# Patient Record
Sex: Male | Born: 1945 | Race: Black or African American | Hispanic: No | Marital: Married | Smoking: Never smoker
Health system: Southern US, Community
[De-identification: ages and names within clinical notes are randomized; demographics above are authoritative.]

## PROBLEM LIST (undated history)

## (undated) DIAGNOSIS — I10 Essential (primary) hypertension: Secondary | ICD-10-CM

---

## 2006-04-06 ENCOUNTER — Inpatient Hospital Stay (HOSPITAL_COMMUNITY): Admission: RE | Admit: 2006-04-06 | Discharge: 2006-04-13 | Payer: Self-pay | Admitting: Neurosurgery

## 2006-05-14 ENCOUNTER — Ambulatory Visit: Payer: Self-pay | Admitting: Infectious Diseases

## 2006-05-14 ENCOUNTER — Inpatient Hospital Stay (HOSPITAL_COMMUNITY): Admission: AD | Admit: 2006-05-14 | Discharge: 2006-05-17 | Payer: Self-pay | Admitting: Neurosurgery

## 2006-06-13 ENCOUNTER — Ambulatory Visit (HOSPITAL_COMMUNITY): Admission: RE | Admit: 2006-06-13 | Discharge: 2006-06-13 | Payer: Self-pay | Admitting: Neurosurgery

## 2006-07-02 ENCOUNTER — Ambulatory Visit: Payer: Self-pay | Admitting: Infectious Diseases

## 2006-08-13 ENCOUNTER — Ambulatory Visit: Payer: Self-pay | Admitting: Infectious Diseases

## 2006-08-13 LAB — CONVERTED CEMR LAB
ALT: 15 units/L (ref 0–53)
AST: 16 units/L (ref 0–37)
Albumin: 4.7 g/dL (ref 3.5–5.2)
Calcium: 9.4 mg/dL (ref 8.4–10.5)
Chloride: 108 meq/L (ref 96–112)
Platelets: 355 10*3/uL (ref 152–374)
Potassium: 4.3 meq/L (ref 3.5–5.3)
Sed Rate: 14 mm/hr (ref 0–16)
Sodium: 140 meq/L (ref 135–145)
WBC: 8.7 10*3/uL (ref 3.7–10.0)

## 2008-07-21 IMAGING — CR DG LUMBAR SPINE 1V
1 series · 1 of 1 positions shown · non-contrast
Comparison: none

CLINICAL DATA: History of   stenosis and operative fusion.
 LUMBAR SPINE - 1 VIEW:
 A single lateral view is presented for evaluation.  Surgical hardware was placed along the posterior aspect of the L5 vertebral body.  The patient has multi-level degenerative endplate changes.  Alignment is grossly normal on this limited study.

[view not recorded]
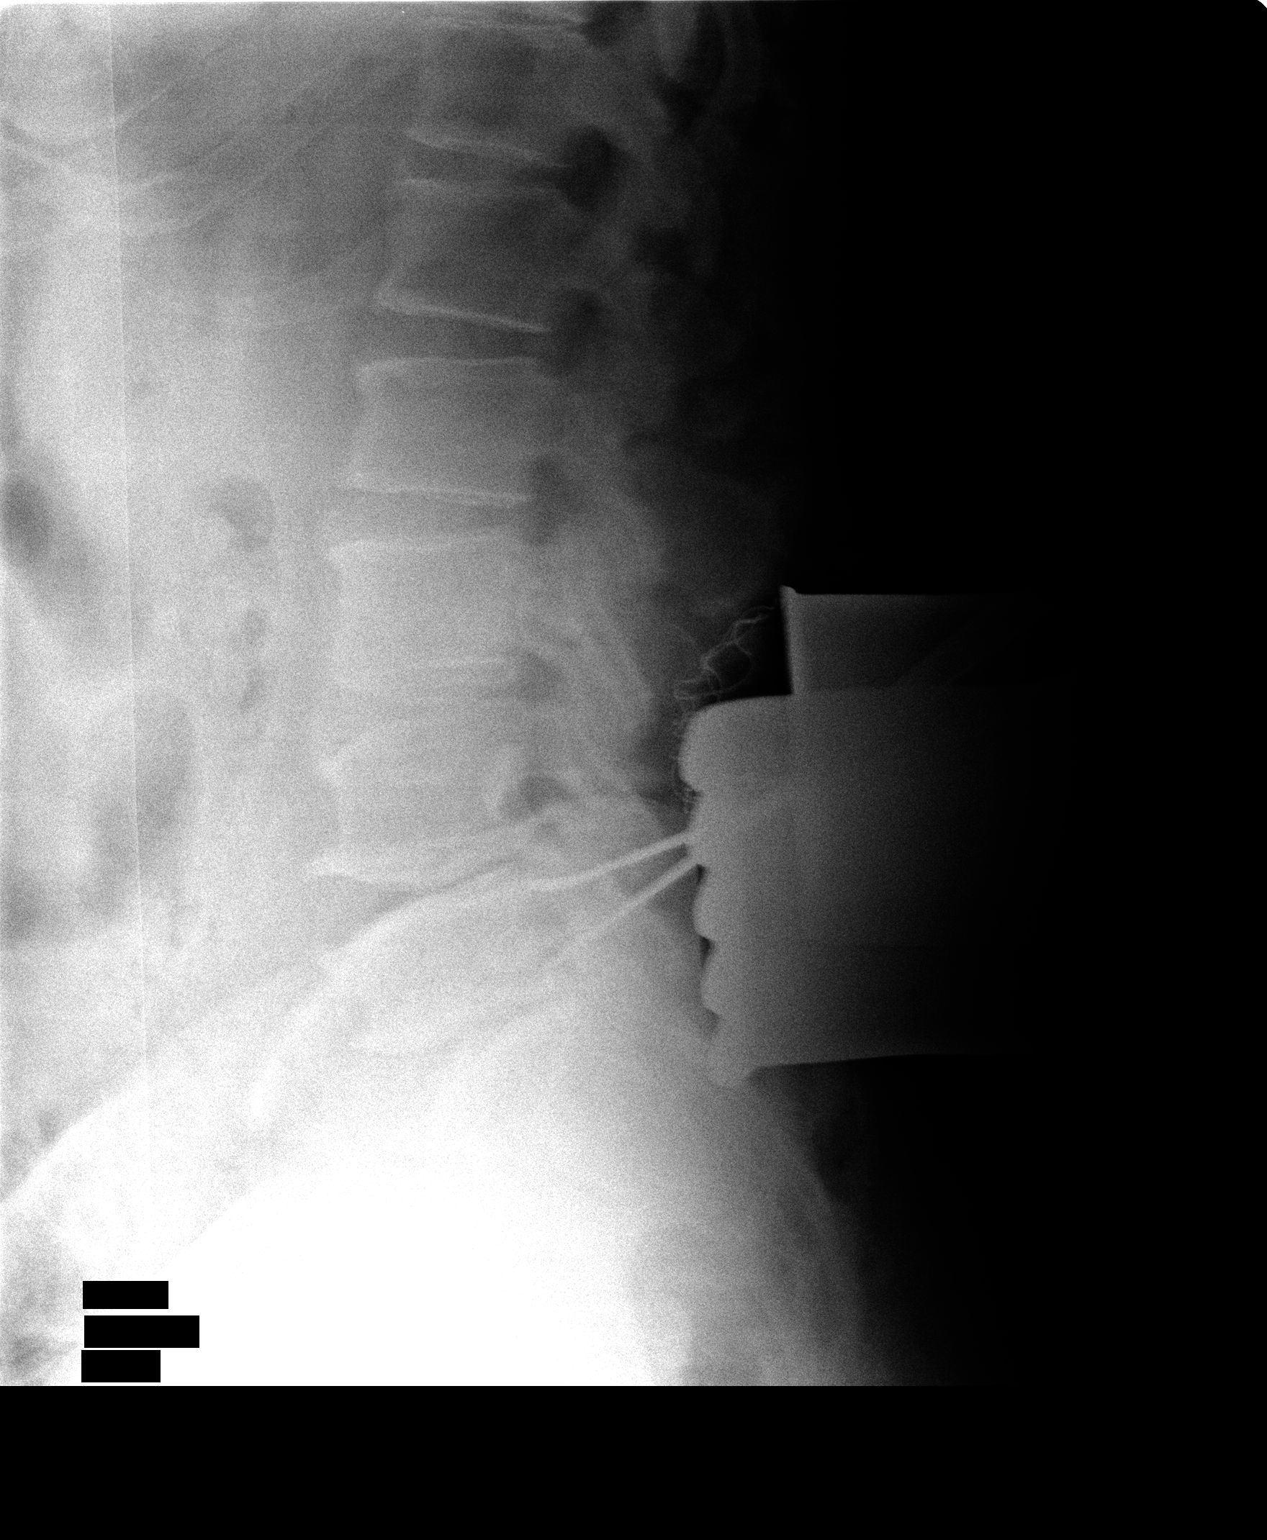

[1 of 1 positions shown; findings below may reference images not displayed]

IMPRESSION: Surgical marking along the posterior aspect of L5.

## 2008-07-21 IMAGING — RF DG LUMBAR SPINE 2-3V
1 series · 2 of 2 positions shown · non-contrast
Comparison: none

HISTORY: Spondylosis, stenosis, L4-L5 and L5-S1 discectomy and interbody fusion

LUMBAR SPINE 2 VIEWS:
Two fluoroscopic images were obtained intraoperatively.
AP and lateral views were formed.
Bilateral pedicle screws at L4, L5, and S1.

[Series 1: run · 2 of 2 slices shown]
[im 1/2]
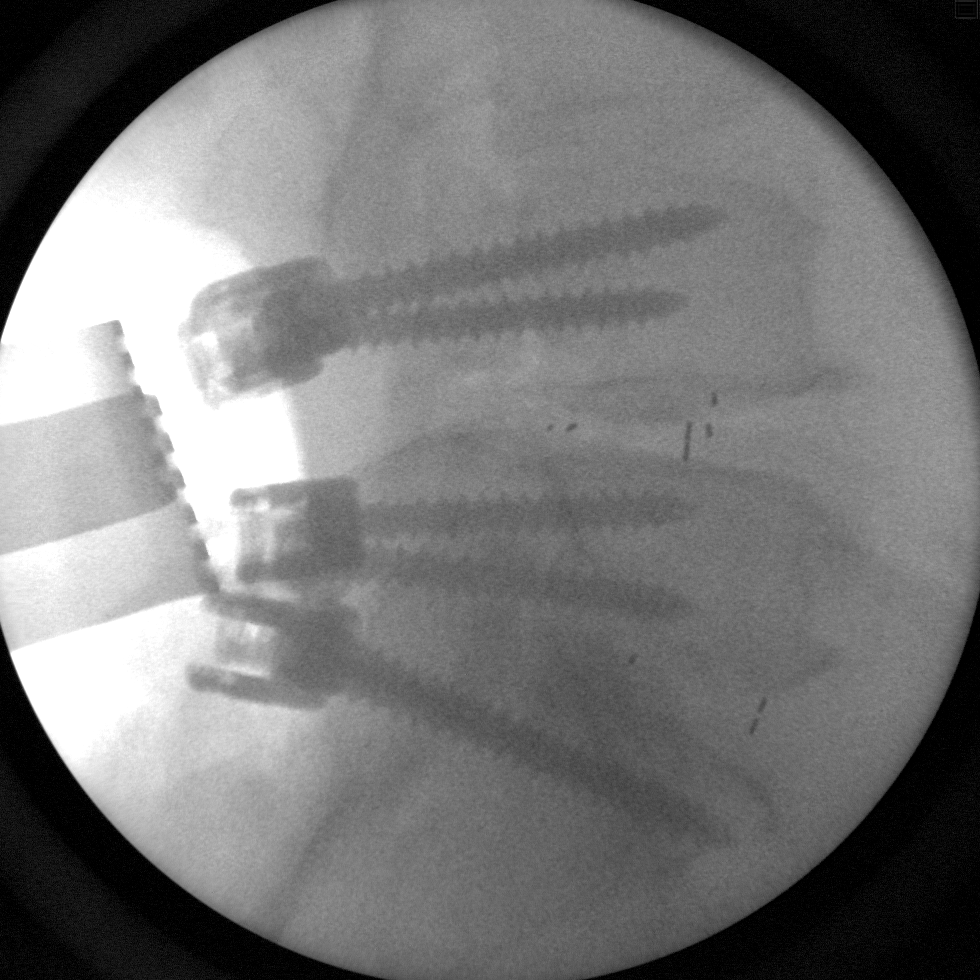
[im 2/2]
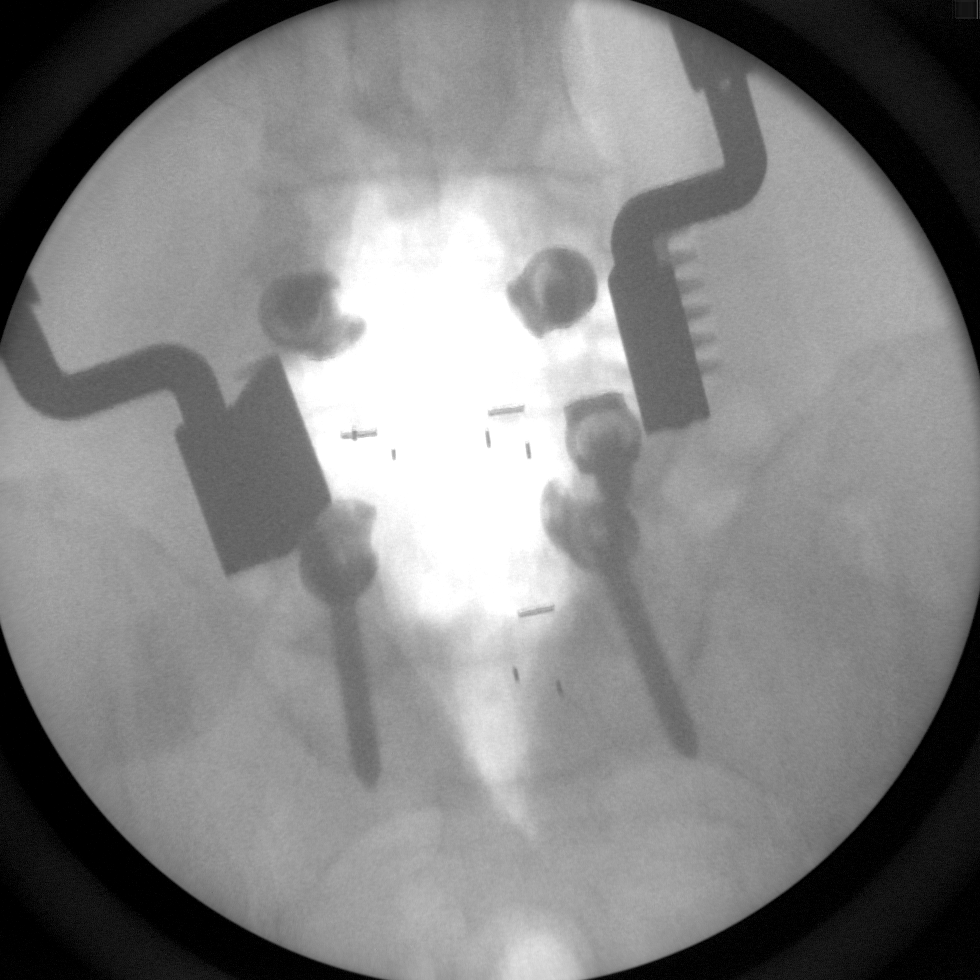

[2 of 2 positions shown; findings below may reference images not displayed]

IMPRESSION: Intraoperative images obtained during posterior fusion of L4-L5 and L5-S1.

## 2008-07-22 IMAGING — CR DG CHEST 1V PORT
1 series · 1 of 1 positions shown · non-contrast
Comparison: None
COMPARISON: None

CLINICAL DATA: Abdominal distention

PORTABLE CHEST - 1 VIEW:

[view not recorded]
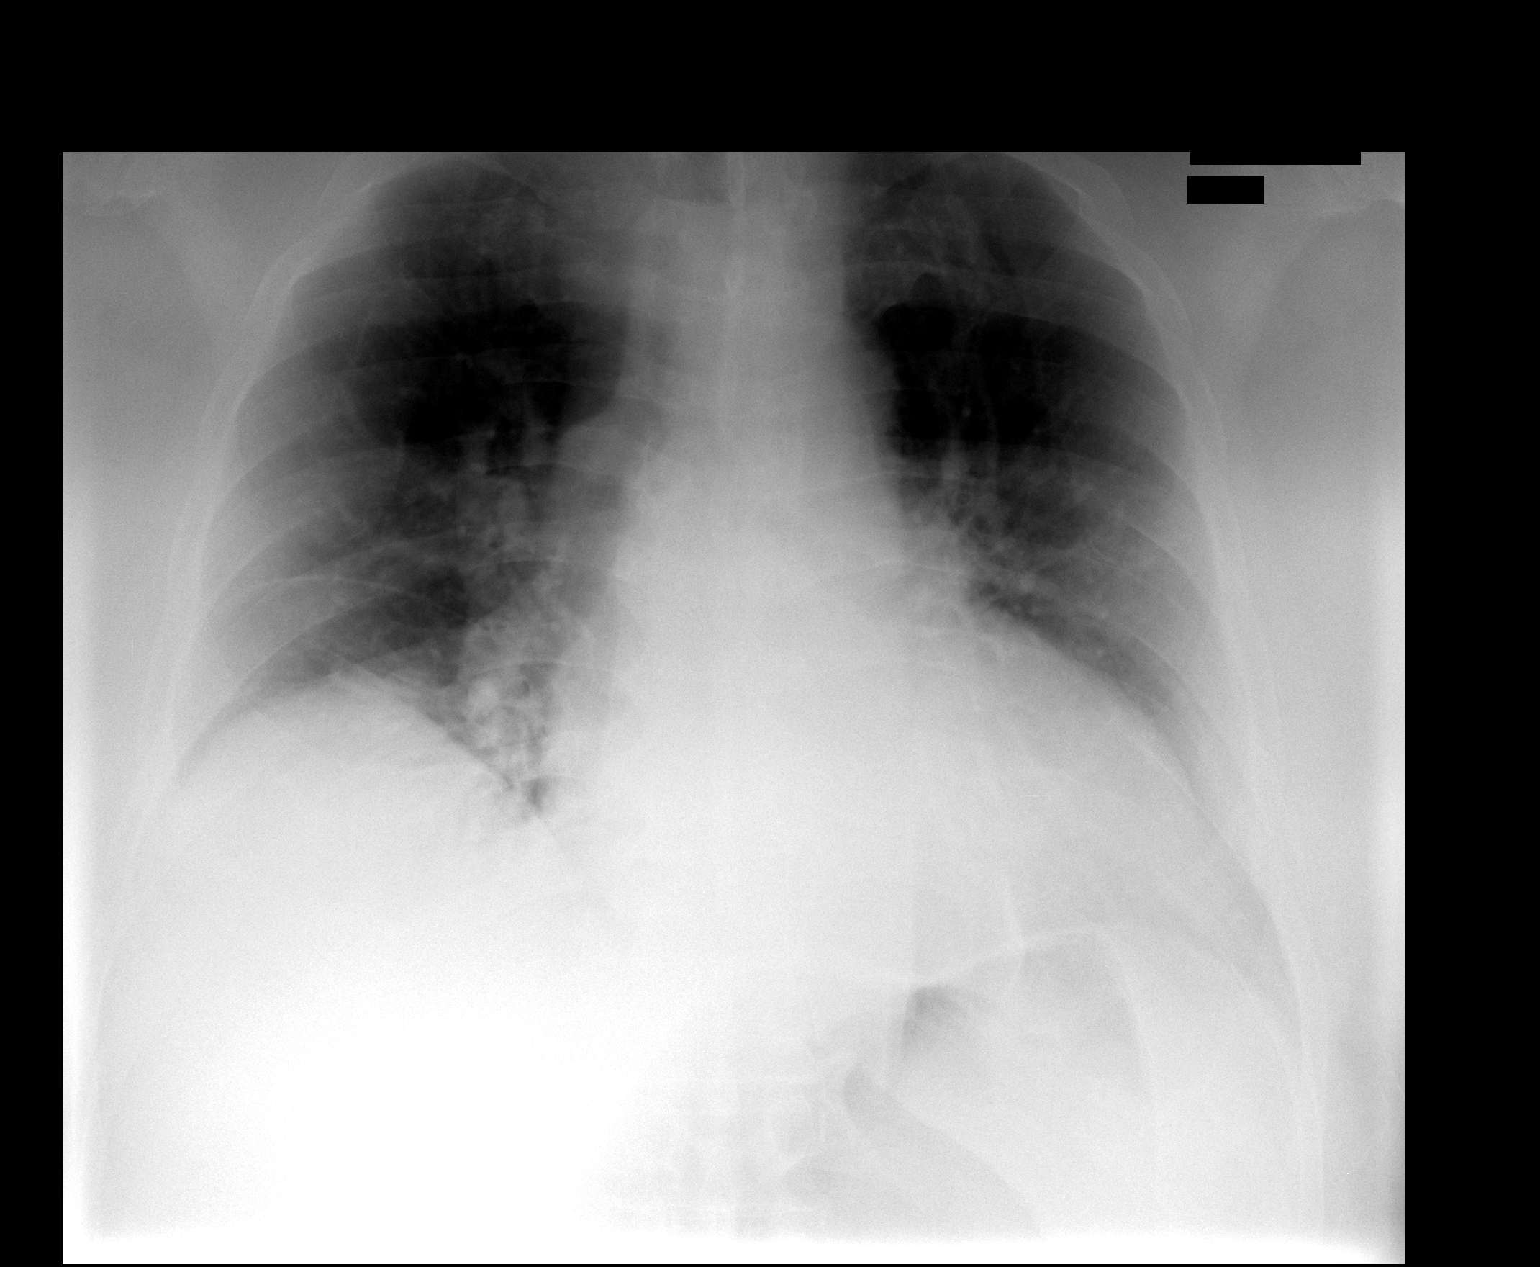

[1 of 1 positions shown; findings below may reference images not displayed]

FINDINGS: There is cardiomegaly. There is mild elevation of the right
hemidiaphragm with right basilar atelectasis or infiltrate. Minimal left base
atelectasis. No effusions.
IMPRESSION: Bibasilar opacities, right greater than left, likely atelectasis. Recommend
clinical correlation to exclude pneumonia in the right base.

Cardiomegaly

PORTABLE ABDOMEN - 1 VIEW
FINDINGS: There is marked gaseous distention of the stomach. Gas noted
throughout minimally distended small bowel as well. Patient status post
posterior lumbar fusion. Degenerative changes in the hips.

IMPRESSION

Marked gaseous distention of the stomach. Gas scattered throughout the small
bowel loops, much less distended than the stomach.

## 2013-11-11 ENCOUNTER — Ambulatory Visit (HOSPITAL_COMMUNITY): Admission: RE | Admit: 2013-11-11 | Payer: Self-pay | Source: Ambulatory Visit | Admitting: Orthopaedic Surgery

## 2013-11-11 ENCOUNTER — Encounter (HOSPITAL_COMMUNITY): Admission: RE | Payer: Self-pay | Source: Ambulatory Visit

## 2013-11-11 SURGERY — SYNOVECTOMY
Anesthesia: General | Site: Knee | Laterality: Right

## 2022-03-26 ENCOUNTER — Other Ambulatory Visit: Payer: Self-pay

## 2022-03-26 ENCOUNTER — Inpatient Hospital Stay (HOSPITAL_COMMUNITY)
Admission: AD | Admit: 2022-03-26 | Discharge: 2022-03-28 | DRG: 378 | Disposition: A | Discharge status: Home / Self Care | Payer: Medicare Other | Source: Other Acute Inpatient Hospital | Attending: Internal Medicine | Admitting: Internal Medicine

## 2022-03-26 DIAGNOSIS — K254 Chronic or unspecified gastric ulcer with hemorrhage: Secondary | ICD-10-CM | POA: Diagnosis present

## 2022-03-26 DIAGNOSIS — Z86010 Personal history of colonic polyps: Secondary | ICD-10-CM | POA: Diagnosis not present

## 2022-03-26 DIAGNOSIS — D62 Acute posthemorrhagic anemia: Secondary | ICD-10-CM | POA: Diagnosis present

## 2022-03-26 DIAGNOSIS — N179 Acute kidney failure, unspecified: Secondary | ICD-10-CM | POA: Diagnosis present

## 2022-03-26 DIAGNOSIS — N400 Enlarged prostate without lower urinary tract symptoms: Secondary | ICD-10-CM | POA: Diagnosis present

## 2022-03-26 DIAGNOSIS — Z7982 Long term (current) use of aspirin: Secondary | ICD-10-CM | POA: Diagnosis not present

## 2022-03-26 DIAGNOSIS — K921 Melena: Secondary | ICD-10-CM | POA: Diagnosis not present

## 2022-03-26 DIAGNOSIS — I452 Bifascicular block: Secondary | ICD-10-CM | POA: Diagnosis present

## 2022-03-26 DIAGNOSIS — K319 Disease of stomach and duodenum, unspecified: Secondary | ICD-10-CM | POA: Diagnosis present

## 2022-03-26 DIAGNOSIS — E785 Hyperlipidemia, unspecified: Secondary | ICD-10-CM | POA: Diagnosis present

## 2022-03-26 DIAGNOSIS — I10 Essential (primary) hypertension: Secondary | ICD-10-CM | POA: Diagnosis present

## 2022-03-26 DIAGNOSIS — K641 Second degree hemorrhoids: Secondary | ICD-10-CM | POA: Diagnosis present

## 2022-03-26 DIAGNOSIS — K5731 Diverticulosis of large intestine without perforation or abscess with bleeding: Secondary | ICD-10-CM | POA: Diagnosis present

## 2022-03-26 DIAGNOSIS — D123 Benign neoplasm of transverse colon: Secondary | ICD-10-CM | POA: Diagnosis not present

## 2022-03-26 DIAGNOSIS — K635 Polyp of colon: Secondary | ICD-10-CM | POA: Diagnosis present

## 2022-03-26 DIAGNOSIS — K449 Diaphragmatic hernia without obstruction or gangrene: Secondary | ICD-10-CM | POA: Diagnosis present

## 2022-03-26 DIAGNOSIS — K922 Gastrointestinal hemorrhage, unspecified: Secondary | ICD-10-CM | POA: Diagnosis present

## 2022-03-26 DIAGNOSIS — K259 Gastric ulcer, unspecified as acute or chronic, without hemorrhage or perforation: Secondary | ICD-10-CM | POA: Diagnosis not present

## 2022-03-26 DIAGNOSIS — R195 Other fecal abnormalities: Secondary | ICD-10-CM | POA: Diagnosis not present

## 2022-03-26 HISTORY — DX: Essential (primary) hypertension: I10

## 2022-03-26 MED ORDER — PANTOPRAZOLE SODIUM 40 MG IV SOLR
40.0000 mg | INTRAVENOUS | Status: DC
  Administered 2022-03-27: 40 mg via INTRAVENOUS
  Filled 2022-03-26: qty 10

## 2022-03-26 MED ORDER — SODIUM CHLORIDE 0.9 % IV SOLN: INTRAVENOUS | Status: AC

## 2022-03-26 NOTE — Assessment & Plan Note (Addendum)
Patient presents with rectal bleed for past 3 days.  On daily aspirin.  No previous history of GI bleed.  Possibly diverticular bleed but he is also complaining of epigastric pain. -We will continue IV Protonix and keep on IV fluids -Keep n.p.o. -Hgb stable at 10, will follow with morning labs -Message sent to [facility] for consult in the morning

## 2022-03-26 NOTE — H&P (Addendum)
History and Physical    Patient: Tristan Houston QMG:[id number] DOB: May 09, 1946 DOA: 03/26/2022 DOS: the patient was seen and examined on 03/26/2022 PCP: Pcp, No  Patient coming from: [facility]  Chief Complaint: bright red blood per rectum  HPI: Tristan Houston is a 76 y.o. male with medical history significant of  HTN, HLD, BPH who presents as a transfer from [facility] for GI bleed.  Patient presented to the ED earlier today after he noted about 3 days of diarrhea with bright red blood per rectum.  Last episode was earlier this morning associated with dizziness and diaphoresis prompting him to present to the ED.  He has also noticed some epigastric pain and diffuse abdominal cramping.  Denies any personal history of GI bleed.  He is on a daily aspirin with last dose this morning.  No other NSAID use.  He does take iron supplementation.  Reports having a colonoscopy about 4 to 5 years ago in [place] and was told to return back in another 10 years.  In the ED he was hemodynamically stable with EKG showing RBBB and LAFB.  FOBT was positive with melena on exam.  Hemoglobin was stable at 10.  Platelet of 217.  Creatinine of 1.44 with BUN of 28.  He was given IV Protonix and IV fluids in the ED.  Review of Systems: As mentioned in the history of present illness. All other systems reviewed and are negative.  Social History: Denies any tobacco, alcohol illicit drug use  Prior to Admission medications   Not on File    Physical Exam: Vitals:   03/26/22 1837  BP: (!) 168/71  Pulse: 71  Resp: 20  Temp: 98 F (36.7 C)  TempSrc: Oral  SpO2: 100%   Constitutional: NAD, calm, comfortable, elderly male laying comfortably in bed watching TV Eyes:  lids and conjunctivae normal ENMT: Mucous membranes are moist.  Neck: normal, supple,  Respiratory: clear to auscultation bilaterally, no wheezing, no crackles. Normal respiratory effort. No accessory muscle use.  Cardiovascular:  Regular rate and rhythm, no murmurs / rubs / gallops. No extremity edema.  Abdomen: Soft, nondistended, mild epigastric tenderness.  No rebound or guarding, or rigidity. bowel sounds positive.  Musculoskeletal: no clubbing / cyanosis. No joint deformity upper and lower extremities. Good ROM, no contractures. Normal muscle tone.  Skin: no rashes, lesions, ulcers.  Neurologic: CN 2-12 grossly intact.  Strength 5/5 in all 4.  Psychiatric: Normal judgment and insight. Alert and oriented x 3. Normal mood. Data Reviewed:  See HPI  Assessment and Plan: * Acute GI bleeding Patient presents with rectal bleed for past 3 days.  On daily aspirin.  No previous history of GI bleed.  Possibly diverticular bleed but he is also complaining of epigastric pain. -We will continue IV Protonix and keep on IV fluids -Keep n.p.o. -Hgb stable at 10, will follow with morning labs -Message sent to [facility] for consult in the morning  HLD (hyperlipidemia) Continue home meds when able to take PO  AKI (acute kidney injury) ([place]) Creatinine is elevated to 1.44 with BUN of 28.  Unclear baseline.  Continue IV fluids and monitor.   HTN (hypertension) Hold home antihypertensive for now on p.o.      Advance Care Planning:   Code Status: Full Code   Consults: Needs GI consult in the morning  Family Communication: none in the morning   Severity of Illness: The appropriate patient status for this patient is INPATIENT. Inpatient status is  judged to be reasonable and necessary in order to provide the required intensity of service to ensure the patient's safety. The patient's presenting symptoms, physical exam findings, and initial radiographic and laboratory data in the context of their chronic comorbidities is felt to place them at high risk for further clinical deterioration. Furthermore, it is not anticipated that the patient will be medically stable for discharge from the hospital within 2 midnights of admission.    * I certify that at the point of admission it is my clinical judgment that the patient will require inpatient hospital care spanning beyond 2 midnights from the point of admission due to high intensity of service, high risk for further deterioration and high frequency of surveillance required.*  Author: Orene Desanctis, DO 03/26/2022 8:04 PM  For on call review [email or web address].

## 2022-03-26 NOTE — Assessment & Plan Note (Signed)
Hold home antihypertensive for now on p.o.

## 2022-03-26 NOTE — Assessment & Plan Note (Signed)
Creatinine is elevated to 1.44 with BUN of 28.  Unclear baseline.  Continue IV fluids and monitor.

## 2022-03-26 NOTE — Assessment & Plan Note (Signed)
Continue home meds when able to take PO

## 2022-03-27 ENCOUNTER — Encounter (HOSPITAL_COMMUNITY): Payer: Self-pay | Admitting: Family Medicine

## 2022-03-27 DIAGNOSIS — K921 Melena: Secondary | ICD-10-CM

## 2022-03-27 DIAGNOSIS — D62 Acute posthemorrhagic anemia: Secondary | ICD-10-CM

## 2022-03-27 DIAGNOSIS — Z86010 Personal history of colonic polyps: Secondary | ICD-10-CM

## 2022-03-27 DIAGNOSIS — K922 Gastrointestinal hemorrhage, unspecified: Secondary | ICD-10-CM | POA: Diagnosis not present

## 2022-03-27 DIAGNOSIS — R195 Other fecal abnormalities: Secondary | ICD-10-CM

## 2022-03-27 DIAGNOSIS — N400 Enlarged prostate without lower urinary tract symptoms: Secondary | ICD-10-CM | POA: Insufficient documentation

## 2022-03-27 LAB — CBC
HCT: 24.1 % — ABNORMAL LOW (ref 39.0–52.0)
HCT: 25.3 % — ABNORMAL LOW (ref 39.0–52.0)
Hemoglobin: 7.9 g/dL — ABNORMAL LOW (ref 13.0–17.0)
Hemoglobin: 8.5 g/dL — ABNORMAL LOW (ref 13.0–17.0)
MCH: 31.1 pg (ref 26.0–34.0)
MCH: 32 pg (ref 26.0–34.0)
MCHC: 32.8 g/dL (ref 30.0–36.0)
MCHC: 33.6 g/dL (ref 30.0–36.0)
MCV: 94.9 fL (ref 80.0–100.0)
MCV: 95.1 fL (ref 80.0–100.0)
Platelets: 200 10*3/uL (ref 150–400)
Platelets: 217 10*3/uL (ref 150–400)
RBC: 2.54 MIL/uL — ABNORMAL LOW (ref 4.22–5.81)
RBC: 2.66 MIL/uL — ABNORMAL LOW (ref 4.22–5.81)
RDW: 11.9 % (ref 11.5–15.5)
RDW: 11.9 % (ref 11.5–15.5)
WBC: 5.5 10*3/uL (ref 4.0–10.5)
WBC: 5.7 10*3/uL (ref 4.0–10.5)
nRBC: 0 % (ref 0.0–0.2)
nRBC: 0 % (ref 0.0–0.2)

## 2022-03-27 LAB — COMPREHENSIVE METABOLIC PANEL
ALT: 17 U/L (ref 0–44)
AST: 20 U/L (ref 15–41)
Albumin: 3.2 g/dL — ABNORMAL LOW (ref 3.5–5.0)
Alkaline Phosphatase: 45 U/L (ref 38–126)
Anion gap: 5 (ref 5–15)
BUN: 16 mg/dL (ref 8–23)
CO2: 24 mmol/L (ref 22–32)
Calcium: 8.5 mg/dL — ABNORMAL LOW (ref 8.9–10.3)
Chloride: 108 mmol/L (ref 98–111)
Creatinine, Ser: 1.2 mg/dL (ref 0.61–1.24)
GFR, Estimated: >60 mL/min (ref >60)
Glucose, Bld: 157 mg/dL — ABNORMAL HIGH (ref 70–99)
Potassium: 4 mmol/L (ref 3.5–5.1)
Sodium: 137 mmol/L (ref 135–145)
Total Bilirubin: 0.8 mg/dL (ref 0.3–1.2)
Total Protein: 5.1 g/dL — ABNORMAL LOW (ref 6.5–8.1)

## 2022-03-27 LAB — AMYLASE: Amylase: 129 U/L — ABNORMAL HIGH (ref 28–100)

## 2022-03-27 LAB — LIPASE, BLOOD: Lipase: 85 U/L — ABNORMAL HIGH (ref 11–51)

## 2022-03-27 MED ORDER — SIMVASTATIN 20 MG PO TABS: 40.0000 mg | ORAL_TABLET | Freq: Every evening | ORAL | Status: DC

## 2022-03-27 MED ORDER — METOCLOPRAMIDE HCL 5 MG/ML IJ SOLN
10.0000 mg | Freq: Four times a day (QID) | INTRAMUSCULAR | Status: AC
  Administered 2022-03-27 (×2): 10 mg via INTRAVENOUS
  Filled 2022-03-27 (×2): qty 2

## 2022-03-27 MED ORDER — PEG-KCL-NACL-NASULF-NA ASC-C 100 G PO SOLR
0.5000 | Freq: Once | ORAL | Status: AC
  Administered 2022-03-27: 100 g via ORAL
  Filled 2022-03-27: qty 1

## 2022-03-27 MED ORDER — BISACODYL 5 MG PO TBEC
20.0000 mg | DELAYED_RELEASE_TABLET | Freq: Once | ORAL | Status: AC
  Administered 2022-03-27: 20 mg via ORAL
  Filled 2022-03-27: qty 4

## 2022-03-27 MED ORDER — PEG-KCL-NACL-NASULF-NA ASC-C 100 G PO SOLR: 1.0000 | Freq: Once | ORAL | Status: DC

## 2022-03-27 NOTE — Consult Note (Signed)
[facility] Consult: 8:14 AM 03/27/2022  LOS: 1 day    Referring Provider: Dr Si Raider  Primary Care Physician:  Pcp, No Primary Gastroenterologist:  unassigned.   GI MD is in [place], Dr. Wilburn Cornelia.     Reason for Consultation:  bloody stool, diarrhea, abd pain.     HPI: Tristan Houston is a 76 y.o. male.  Transfer from [place], due to lack of GI coverage.  PMH hypertension.  Hyperlipidemia. 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  BPH, 05/2018 cystoscopy, TURP.  Erectile dysfunction.  DDD, s/p L5/S1 fusion, hardware placement.  Daily oral iron for many years, chronic Voltaren bid.  At least 4 lifetime colonoscopies, previous polyps, no diverticulosis.  Latest colon ~ 5 years ago and advised to have repeat in 10 years.  EGD ~ 10 years ago, does not recall pathology but used to take PPI, no longer needs this.  Also takes 81 mg aspirin though a cardiologist has told him in the past it was not necessary.   no Epic GI records.   Starting Friday, 4 days ago, developed loose stool. Saturday PM began passing bright red blood in the stool.  Sunday morning he had a recurrent episode of bloody stool and that was the last time he had any stool or bleeding.  Stools are normally black in color due to daily iron supplement.  Mild abd crampy distress/sense of abdominal fullness, no intense pain.  No nausea or vomiting.  Appetite preserved. Went to ED at [facility]. BP 138/73.  Heart rate 71.  Oxygen sats 97%.  Respiratory rate 18.  No fever Hgb 10, MCV 94.  It is 7.9 this AM. Was 12.3, 90 in 04/2021. Platelets, WBCs normal.  Are normal.  BUNs/creatinine 28/1.4, 23/1.4 in 04/2021. Lipase 130.  LFTs normal.   2 view chest, abdominal films unremarkable.  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    PMH/Surg hx as above.    Patient does not drink.  [place]  Does not smoke. Patient is quite active, [facility] [employer] and works out on a bicycle and [employer] equipment 5 days a week. Family history negative for GI bleeding, ulcer disease, anemia, cancers.    Prior to Admission medications   Medication Sig Start Date End Date Taking? Authorizing Provider  diclofenac (VOLTAREN) 75 MG EC tablet Take 75 mg by mouth 2 (two) times daily. 01/23/22  Yes [provider]  ferrous sulfate 325 (65 FE) MG tablet Take 325 mg by mouth daily with breakfast.   Yes [provider]  lisinopril (ZESTRIL) 40 MG tablet Take 40 mg by mouth daily.   Yes [provider]  simvastatin (ZOCOR) 40 MG tablet Take 40 mg by mouth every evening.   Yes [provider]    Scheduled Meds:  pantoprazole (PROTONIX) IV  40 mg Intravenous Q24H   Infusions:  sodium chloride  75 mL/hr at 03/26/22 2028   PRN Meds:    Allergies as of 03/26/2022   (No Known Allergies)    History reviewed. No pertinent family history.  Social History   Socioeconomic History   Marital status: Married    Spouse name: Not on file   Number of children: Not on file   Years of education: Not on file   Highest education level: Not on file  Occupational History   Not on file  Tobacco Use   Smoking status: Never   Smokeless tobacco: Never  Substance and Sexual Activity   Alcohol use: Not Currently   Drug use: Never   Sexual activity: Not on file  Other Topics Concern   Not on file  Social History Narrative   Not on file   Social Determinants of Health   Financial Resource Strain: Not on file  Food Insecurity: Not on file  Transportation Needs: Not on file  Physical Activity: Not on file  Stress: Not on file  Social Connections: Not on file   Intimate Partner Violence: Not on file    REVIEW OF SYSTEMS: Constitutional: No dizziness, no fatigue, no weakness ENT:  No nose bleeds Pulm: No shortness of breath.  No cough CV:  No palpitations, no LE edema.  No chest pain/angina. GU:  No hematuria, no frequency GI: See HPI.  No dysphagia.  No anorexia. Heme: No unusual bleeding or bruising other than this recent bleeding per rectum Transfusions: None. Neuro:  No headaches, no peripheral tingling or numbness.  No dizziness.  No seizures.  No syncope Derm:  No itching, no rash or sores.  Endocrine:  No sweats or chills.  No polyuria or dysuria Immunization: Not queried. Travel: Not queried.   PHYSICAL EXAM: Vital signs in last 24 hours: Vitals:   03/26/22 2204 03/27/22 0456  BP: (!) 162/73 120/65  Pulse: 76 71  Resp: 19 18  Temp: 98.4 F (36.9 C) 97.9 F (36.6 C)  SpO2: 100% 98%   Wt Readings from Last 3 Encounters:  No data found for Wt    General: Pleasant, well-appearing, comfortable, alert. Head: No facial asymmetry or swelling.  No signs of head trauma. Eyes: Conjunctiva pale.  No scleral icterus.  EOMI. Ears: No hearing loss Nose: No congestion or discharge Mouth: Good dentition.  Tongue midline.  Mucosa moist, pink, clear. Neck: No JVD, no masses, no thyromegaly Lungs: Clear with good breath sounds bilaterally.  No cough.  No labored breathing. Heart: RRR.  No MRG.  S1, S2 Abdomen: Soft.  Nontender.  No masses, HSM, bruits, hernias.  Active bowel sounds.  No distention..   Rectal: No visible or palpable masses.  Exam finger clean without blood or stool. Musc/Skeltl: No joint redness, swelling or gross deformity. Extremities: No CCE. Neurologic: Good historian.  Alert.  Appropriate.  Oriented x3.  Moves all 4 limbs without tremor, strength not tested.  No gross deficits. Skin: No rash, no sores, no suspicious lesions. Nodes: No cervical adenopathy Psych: Calm, cooperative, pleasant.  Fluid  speech.  Intake/Output from previous day: 07/30 0701 - 07/31 0700 In: 834.1 [P.O.:120; I.V.:714.1] Out: 1050 [Urine:1050] Intake/Output this shift: No intake/output data recorded.  LAB RESULTS: Recent Labs    03/27/22 0242  WBC 5.5  HGB 7.9*  HCT 24.1*  PLT 200   BMET Lab Results  Component Value Date   NA 140 08/13/2006   K 4.3 08/13/2006   CL 108 08/13/2006   CO2 23 08/13/2006   GLUCOSE  87 08/13/2006   BUN 20 08/13/2006   CREATININE 1.23 08/13/2006   CALCIUM 9.4 08/13/2006   LFT No results for input(s): "PROT", "ALBUMIN", "AST", "ALT", "ALKPHOS", "BILITOT", "BILIDIR", "IBILI" in the last 72 hours. PT/INR No results found for: "INR", "PROTIME" Hepatitis Panel No results for input(s): "HEPBSAG", "HCVAB", "HEPAIGM", "HEPBIGM" in the last 72 hours. C-Diff No components found for: "CDIFF" Lipase  No results found for: "LIPASE"  Drugs of Abuse  No results found for: "LABOPIA", "COCAINSCRNUR", "LABBENZ", "AMPHETMU", "THCU", "LABBARB"   RADIOLOGY STUDIES: No results found.    IMPRESSION:   Bleeding per rectum, bloody stool  Normocytic anemia.  Assumed history of iron deficiency as he takes RXd daily oral iron  Elevated lipase.  LFTs normal has not had CT or other advanced imaging of the liver, gallbladder.   PLAN:     EGD and colonoscopy 0830 tomorrow.  Clears today.  Continue PPI for now.  Recheck CBC, lipase, CMET this afternoon.  Transfuse if Hgb drops below 7 or patient becomes significantly symptomatic.  See bowel prep orders.   Azucena Freed  03/27/2022, 8:14 AM Phone [phone]

## 2022-03-27 NOTE — Progress Notes (Signed)
Mobility Specialist - Progress Note   03/27/22 1000  Mobility  Activity Ambulated independently in hallway  Level of Assistance Independent  Assistive Device None  Distance Ambulated (ft) 480 ft  Activity Response Tolerated well   Pt received in bed and agreeable to mobility. Left in bed w/ call bell in reach and all needs met.   Paulla Dolly Mobility Specialist

## 2022-03-27 NOTE — Progress Notes (Signed)
PROGRESS NOTE    Tristan Houston  NWG:[id number] DOB: 1945-11-27 DOA: 03/26/2022 PCP: Pcp, No     Brief Narrative:   From admission h and p  Tristan Houston is a 76 y.o. male with medical history significant of  HTN, HLD, BPH who presents as a transfer from [facility] for GI bleed.   Patient presented to the ED earlier today after he noted about 3 days of diarrhea with bright red blood per rectum.  Last episode was earlier this morning associated with dizziness and diaphoresis prompting him to present to the ED.  He has also noticed some epigastric pain and diffuse abdominal cramping.  Denies any personal history of GI bleed.  He is on a daily aspirin with last dose this morning.  No other NSAID use.  He does take iron supplementation.  Reports having a colonoscopy about 4 to 5 years ago in [place] and was told to return back in another 10 years.   In the ED he was hemodynamically stable with EKG showing RBBB and LAFB.  FOBT was positive with melena on exam.  Hemoglobin was stable at 10.  Platelet of 217.  Creatinine of 1.44 with BUN of 28.   He was given IV Protonix and IV fluids in the ED.  Assessment & Plan:   Principal Problem:   Acute GI bleeding Active Problems:   HTN (hypertension)   AKI (acute kidney injury) ([place])   HLD (hyperlipidemia)  # GI bleed # Acute blood loss anemia Several days hematochezia with mild generalized abdominal discomfort. Had some loose stool that is resolved. NO vomiting. Hemodynamically stable, asymptomatic. Hgb 8.5 today from baseline of around 10. Last bm this morning small amount of stool and blood. On asa at home for primary prevention. Last colonoscopy about 5 years ago per patient report. Denies history GI bleed. - transfuse for hgb less than 7 or symptoms, monitor hgb - GI following, plan for EGD +/- colonoscopy tomorrow, prep tonight - cont PPI IV BID - will communicate with nurse needs 2 functional PIVs  # HTN Here bp wnl - hold  home lisinopril given blood loss anemia - cont home statin - would plan on holding aspirin at d/c   DVT prophylaxis: SCDs Code Status: full Family Communication: wife and sons updated @ bedside 7/31  Level of care: Telemetry Medical Status is: Inpatient Remains inpatient appropriate because: severity of illness    Consultants:  GI  Procedures: none  Antimicrobials:  none    Subjective: Ongoing mild abd discomfort.   Objective: Vitals:   03/26/22 1837 03/26/22 2204 03/27/22 0456 03/27/22 0849  BP: (!) 168/71 (!) 162/73 120/65 (!) 141/66  Pulse: 71 76 71 72  Resp: 20 19 18 16   Temp: 98 F (36.7 C) 98.4 F (36.9 C) 97.9 F (36.6 C) 98.9 F (37.2 C)  TempSrc: Oral Oral Oral Oral  SpO2: 100% 100% 98% 100%    Intake/Output Summary (Last 24 hours) at 03/27/2022 1357 Last data filed at 03/27/2022 5784 Gross per 24 hour  Intake 834.05 ml  Output 1775 ml  Net -940.95 ml   There were no vitals filed for this visit.  Examination:  General exam: Appears calm and comfortable  Respiratory system: Clear to auscultation. Respiratory effort normal. Cardiovascular system: S1 & S2 heard, RRR. No JVD, murmurs, rubs, gallops or clicks. No pedal edema. Gastrointestinal system: Abdomen is nondistended, soft and nontender. No organomegaly or masses felt. Normal bowel sounds heard. Central nervous system: Alert and  oriented. No focal neurological deficits. Extremities: Symmetric 5 x 5 power. Skin: No rashes, lesions or ulcers Psychiatry: Judgement and insight appear normal. Mood & affect appropriate.     Data Reviewed: I have personally reviewed following labs and imaging studies  CBC: Recent Labs  Lab 03/27/22 0242 03/27/22 1238  WBC 5.5 5.7  HGB 7.9* 8.5*  HCT 24.1* 25.3*  MCV 94.9 95.1  PLT 200 409   Basic Metabolic Panel: Recent Labs  Lab 03/27/22 1238  NA 137  K 4.0  CL 108  CO2 24  GLUCOSE 157*  BUN 16  CREATININE 1.20  CALCIUM 8.5*   GFR: CrCl  cannot be calculated (Unknown ideal weight.). Liver Function Tests: Recent Labs  Lab 03/27/22 1238  AST 20  ALT 17  ALKPHOS 45  BILITOT 0.8  PROT 5.1*  ALBUMIN 3.2*   Recent Labs  Lab 03/27/22 1238  LIPASE 85*  AMYLASE 129*   No results for input(s): "AMMONIA" in the last 168 hours. Coagulation Profile: No results for input(s): "INR", "PROTIME" in the last 168 hours. Cardiac Enzymes: No results for input(s): "CKTOTAL", "CKMB", "CKMBINDEX", "TROPONINI" in the last 168 hours. BNP (last 3 results) No results for input(s): "PROBNP" in the last 8760 hours. HbA1C: No results for input(s): "HGBA1C" in the last 72 hours. CBG: No results for input(s): "GLUCAP" in the last 168 hours. Lipid Profile: No results for input(s): "CHOL", "HDL", "LDLCALC", "TRIG", "CHOLHDL", "LDLDIRECT" in the last 72 hours. Thyroid Function Tests: No results for input(s): "TSH", "T4TOTAL", "FREET4", "T3FREE", "THYROIDAB" in the last 72 hours. Anemia Panel: No results for input(s): "VITAMINB12", "FOLATE", "FERRITIN", "TIBC", "IRON", "RETICCTPCT" in the last 72 hours. Urine analysis: No results found for: "COLORURINE", "APPEARANCEUR", "LABSPEC", "PHURINE", "GLUCOSEU", "HGBUR", "BILIRUBINUR", "KETONESUR", "PROTEINUR", "UROBILINOGEN", "NITRITE", "LEUKOCYTESUR" Sepsis Labs: @LABRCNTIP (procalcitonin:4,lacticidven:4)  )No results found for this or any previous visit (from the past 240 hour(s)).       Radiology Studies: No results found.      Scheduled Meds:  metoCLOPramide (REGLAN) injection  10 mg Intravenous Q6H   pantoprazole (PROTONIX) IV  40 mg Intravenous Q24H   peg 3350 powder  0.5 kit Oral Once   And   peg 3350 powder  0.5 kit Oral Once   Continuous Infusions:   LOS: 1 day     Desma Maxim, MD [facility]   If 7PM-7AM, please contact night-coverage [email or web address] Password [facility] 03/27/2022, 1:57 PM

## 2022-03-27 NOTE — TOC Initial Note (Addendum)
Transition of Care [facility]) - Initial/Assessment Note    Patient Details  Name: Tristan Houston MRN: [id number] Date of Birth: Nov 12, 1945  Transition of Care [facility]) CM/SW Contact:    Tom-Johnson, Renea Ee, RN Phone Number: 03/27/2022, 2:57 PM  Clinical Narrative:                  CM spoke with patient at bedside about needs for post hospital transition. Transferred from [facility] and admitted for Acute GI Bleed. Hgb recheck on admit was 7.9 from 10 at [facility]. Patient is hemodynamically stable. GI following. From home with wife. Has four supporting children. 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 CM called the 72 hr notification hotline and spoke with [facility]. Authorization # is [id number]. Patient has a cane and shower seat at home. Independent with care and drive self prior to admission.  PCP is at the [place] and [facility] [place].  No PT/OT needs noted. Patient denies any needs. CM will continue to follow as patient progresses with care.   Expected Discharge Plan: Home/Self Care Barriers to Discharge: Continued Medical Work up   Patient Goals and CMS Choice Patient states their goals for this hospitalization and ongoing recovery are:: To return home CMS Medicare.gov Compare Post Acute Care list provided to:: Patient Choice offered to / list presented to : NA  Expected Discharge Plan and Services Expected Discharge Plan: Home/Self Care   Discharge Planning Services: CM Consult Post Acute Care Choice: NA Living arrangements for the past 2 months: Single Family Home                 DME Arranged: N/A DME Agency: NA       HH Arranged: NA HH Agency: NA        Prior Living Arrangements/Services Living arrangements for the past 2 months: Single Family Home Lives with:: Spouse Patient language and need for interpreter reviewed:: Yes Do you feel safe going back to the place  where you live?: Yes      Need for Family Participation in Patient Care: Yes (Comment) Care giver support system in place?: Yes (comment) Current home services: DME (Cane, shower seat.) Criminal Activity/Legal Involvement Pertinent to Current Situation/Hospitalization: No - Comment as needed  Activities of Daily Living Home Assistive Devices/Equipment: Cane (specify quad or straight), Eyeglasses, Shower chair without back ADL Screening (condition at time of admission) Patient's cognitive ability adequate to safely complete daily activities?: Yes Is the patient deaf or have difficulty hearing?: No Does the patient have difficulty seeing, even when wearing glasses/contacts?: No Does the patient have difficulty concentrating, remembering, or making decisions?: No Patient able to express need for assistance with ADLs?: Yes Does the patient have difficulty dressing or bathing?: No Independently performs ADLs?: Yes (appropriate for developmental age) Does the patient have difficulty walking or climbing stairs?: No Weakness of Legs: None Weakness of Arms/Hands: None  Permission Sought/Granted Permission sought to share information with : Case Manager, Family Supports Permission granted to share information with : Yes, Verbal Permission Granted              Emotional Assessment Appearance:: Appears stated age Attitude/Demeanor/Rapport: Engaged, Gracious Affect (typically observed): Accepting, Appropriate, Calm, Hopeful, Pleasant Orientation: : Oriented to Self, Oriented to Place, Oriented to  Time, Oriented to Situation Alcohol / Substance Use: Not Applicable Psych Involvement: No (comment)  Admission diagnosis:  Gastrointestinal bleed [K92.2]  Acute GI bleeding [K92.2] Patient Active Problem List   Diagnosis Date Noted   BPH (benign prostatic hyperplasia) 03/27/2022   Acute GI bleeding 03/26/2022   HTN (hypertension) 03/26/2022   AKI (acute kidney injury) ([place]) 03/26/2022   HLD  (hyperlipidemia) 03/26/2022   PCP:  Pcp, No [facility]acy[id number] [place] [id number]: [phone]: [phone]    Social Determinants of Health (SDOH) Interventions    Readmission Risk Interventions     No data to display

## 2022-03-27 NOTE — H&P (View-Only) (Signed)
[facility] Consult: 8:14 AM 03/27/2022  LOS: 1 day    Referring Provider: Dr Si Raider  Primary Care Physician:  Pcp, No Primary Gastroenterologist:  unassigned.   GI MD is in [place], Dr. Wilburn Cornelia.     Reason for Consultation:  bloody stool, diarrhea, abd pain.     HPI: Tristan Houston is a 76 y.o. male.  Transfer from [place], due to lack of GI coverage.  PMH hypertension.  Hyperlipidemia. 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  BPH, 05/2018 cystoscopy, TURP.  Erectile dysfunction.  DDD, s/p L5/S1 fusion, hardware placement.  Daily oral iron for many years, chronic Voltaren bid.  At least 4 lifetime colonoscopies, previous polyps, no diverticulosis.  Latest colon ~ 5 years ago and advised to have repeat in 10 years.  EGD ~ 10 years ago, does not recall pathology but used to take PPI, no longer needs this.  Also takes 81 mg aspirin though a cardiologist has told him in the past it was not necessary.   no Epic GI records.   Starting Friday, 4 days ago, developed loose stool. Saturday PM began passing bright red blood in the stool.  Sunday morning he had a recurrent episode of bloody stool and that was the last time he had any stool or bleeding.  Stools are normally black in color due to daily iron supplement.  Mild abd crampy distress/sense of abdominal fullness, no intense pain.  No nausea or vomiting.  Appetite preserved. Went to ED at [facility]. BP 138/73.  Heart rate 71.  Oxygen sats 97%.  Respiratory rate 18.  No fever Hgb 10, MCV 94.  It is 7.9 this AM. Was 12.3, 90 in 04/2021. Platelets, WBCs normal.  Are normal.  BUNs/creatinine 28/1.4, 23/1.4 in 04/2021. Lipase 130.  LFTs normal.   2 view chest, abdominal films unremarkable.  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    PMH/Surg hx as above.    Patient does not drink.  [place]  Does not smoke. [facility] is quite active, [facility] [employer] and works out on a bicycle and [employer] equipment 5 days a week. Family history negative for GI bleeding, ulcer disease, anemia, cancers.    Prior to Admission medications   Medication Sig Start Date End Date Taking? Authorizing Provider  diclofenac (VOLTAREN) 75 MG EC tablet Take 75 mg by mouth 2 (two) times daily. 01/23/22  Yes [provider]  ferrous sulfate 325 (65 FE) MG tablet Take 325 mg by mouth daily with breakfast.   Yes [provider]  lisinopril (ZESTRIL) 40 MG tablet Take 40 mg by mouth daily.   Yes [provider]  simvastatin (ZOCOR) 40 MG tablet Take 40 mg by mouth every evening.   Yes [provider]    Scheduled Meds:  pantoprazole (PROTONIX) IV  40 mg Intravenous Q24H   Infusions:  sodium chloride  75 mL/hr at 03/26/22 2028   PRN Meds:    Allergies as of 03/26/2022   (No Known Allergies)    History reviewed. No pertinent family history.  Social History   Socioeconomic History   Marital status: Married    Spouse name: Not on file   Number of children: Not on file   Years of education: Not on file   Highest education level: Not on file  Occupational History   Not on file  Tobacco Use   Smoking status: Never   Smokeless tobacco: Never  Substance and Sexual Activity   Alcohol use: Not Currently   Drug use: Never   Sexual activity: Not on file  Other Topics Concern   Not on file  Social History Narrative   Not on file   Social Determinants of Health   Financial Resource Strain: Not on file  Food Insecurity: Not on file  Transportation Needs: Not on file  Physical Activity: Not on file  Stress: Not on file  Social Connections: Not on file   Intimate Partner Violence: Not on file    REVIEW OF SYSTEMS: Constitutional: No dizziness, no fatigue, no weakness ENT:  No nose bleeds Pulm: No shortness of breath.  No cough CV:  No palpitations, no LE edema.  No chest pain/angina. GU:  No hematuria, no frequency GI: See HPI.  No dysphagia.  No anorexia. Heme: No unusual bleeding or bruising other than this recent bleeding per rectum Transfusions: None. Neuro:  No headaches, no peripheral tingling or numbness.  No dizziness.  No seizures.  No syncope Derm:  No itching, no rash or sores.  Endocrine:  No sweats or chills.  No polyuria or dysuria Immunization: Not queried. Travel: Not queried.   PHYSICAL EXAM: Vital signs in last 24 hours: Vitals:   03/26/22 2204 03/27/22 0456  BP: (!) 162/73 120/65  Pulse: 76 71  Resp: 19 18  Temp: 98.4 F (36.9 C) 97.9 F (36.6 C)  SpO2: 100% 98%   Wt Readings from Last 3 Encounters:  No data found for Wt    General: Pleasant, well-appearing, comfortable, alert. Head: No facial asymmetry or swelling.  No signs of head trauma. Eyes: Conjunctiva pale.  No scleral icterus.  EOMI. Ears: No hearing loss Nose: No congestion or discharge Mouth: Good dentition.  Tongue midline.  Mucosa moist, pink, clear. Neck: No JVD, no masses, no thyromegaly Lungs: Clear with good breath sounds bilaterally.  No cough.  No labored breathing. Heart: RRR.  No MRG.  S1, S2 Abdomen: Soft.  Nontender.  No masses, HSM, bruits, hernias.  Active bowel sounds.  No distention..   Rectal: No visible or palpable masses.  Exam finger clean without blood or stool. Musc/Skeltl: No joint redness, swelling or gross deformity. Extremities: No CCE. Neurologic: Good historian.  Alert.  Appropriate.  Oriented x3.  Moves all 4 limbs without tremor, strength not tested.  No gross deficits. Skin: No rash, no sores, no suspicious lesions. Nodes: No cervical adenopathy Psych: Calm, cooperative, pleasant.  Fluid  speech.  Intake/Output from previous day: 07/30 0701 - 07/31 0700 In: 834.1 [P.O.:120; I.V.:714.1] Out: 1050 [Urine:1050] Intake/Output this shift: No intake/output data recorded.  LAB RESULTS: Recent Labs    03/27/22 0242  WBC 5.5  HGB 7.9*  HCT 24.1*  PLT 200   BMET Lab Results  Component Value Date   NA 140 08/13/2006   K 4.3 08/13/2006   CL 108 08/13/2006   CO2 23 08/13/2006   GLUCOSE  87 08/13/2006   BUN 20 08/13/2006   CREATININE 1.23 08/13/2006   CALCIUM 9.4 08/13/2006   LFT No results for input(s): "PROT", "ALBUMIN", "AST", "ALT", "ALKPHOS", "BILITOT", "BILIDIR", "IBILI" in the last 72 hours. PT/INR No results found for: "INR", "PROTIME" Hepatitis Panel No results for input(s): "HEPBSAG", "HCVAB", "HEPAIGM", "HEPBIGM" in the last 72 hours. C-Diff No components found for: "CDIFF" Lipase  No results found for: "LIPASE"  Drugs of Abuse  No results found for: "LABOPIA", "COCAINSCRNUR", "LABBENZ", "AMPHETMU", "THCU", "LABBARB"   RADIOLOGY STUDIES: No results found.    IMPRESSION:   Bleeding per rectum, bloody stool  Normocytic anemia.  Assumed history of iron deficiency as he takes RXd daily oral iron  Elevated lipase.  LFTs normal has not had CT or other advanced imaging of the liver, gallbladder.   PLAN:     EGD and colonoscopy 0830 tomorrow.  Clears today.  Continue PPI for now.  Recheck CBC, lipase, CMET this afternoon.  Transfuse if Hgb drops below 7 or patient becomes significantly symptomatic.  See bowel prep orders.   Azucena Freed  03/27/2022, 8:14 AM Phone [phone]

## 2022-03-28 ENCOUNTER — Encounter (HOSPITAL_COMMUNITY): Payer: Self-pay | Admitting: Family Medicine

## 2022-03-28 ENCOUNTER — Inpatient Hospital Stay (HOSPITAL_COMMUNITY): Payer: Medicare Other | Admitting: Certified Registered Nurse Anesthetist

## 2022-03-28 ENCOUNTER — Encounter (HOSPITAL_COMMUNITY)
Admission: AD | Disposition: A | Discharge status: Home / Self Care | Payer: Self-pay | Source: Other Acute Inpatient Hospital | Attending: Obstetrics and Gynecology

## 2022-03-28 DIAGNOSIS — K2289 Other specified disease of esophagus: Secondary | ICD-10-CM

## 2022-03-28 DIAGNOSIS — K449 Diaphragmatic hernia without obstruction or gangrene: Secondary | ICD-10-CM

## 2022-03-28 DIAGNOSIS — K922 Gastrointestinal hemorrhage, unspecified: Secondary | ICD-10-CM | POA: Diagnosis not present

## 2022-03-28 DIAGNOSIS — K259 Gastric ulcer, unspecified as acute or chronic, without hemorrhage or perforation: Secondary | ICD-10-CM

## 2022-03-28 DIAGNOSIS — D123 Benign neoplasm of transverse colon: Secondary | ICD-10-CM

## 2022-03-28 DIAGNOSIS — K3189 Other diseases of stomach and duodenum: Secondary | ICD-10-CM

## 2022-03-28 HISTORY — PX: POLYPECTOMY: SHX5525

## 2022-03-28 HISTORY — PX: COLONOSCOPY: SHX5424

## 2022-03-28 HISTORY — PX: ESOPHAGOGASTRODUODENOSCOPY (EGD) WITH PROPOFOL: SHX5813

## 2022-03-28 HISTORY — PX: BIOPSY: SHX5522

## 2022-03-28 LAB — CBC
HCT: 26.4 % — ABNORMAL LOW (ref 39.0–52.0)
Hemoglobin: 8.7 g/dL — ABNORMAL LOW (ref 13.0–17.0)
MCH: 31.4 pg (ref 26.0–34.0)
MCHC: 33 g/dL (ref 30.0–36.0)
MCV: 95.3 fL (ref 80.0–100.0)
Platelets: 249 10*3/uL (ref 150–400)
RBC: 2.77 MIL/uL — ABNORMAL LOW (ref 4.22–5.81)
RDW: 11.9 % (ref 11.5–15.5)
WBC: 6.2 10*3/uL (ref 4.0–10.5)
nRBC: 0 % (ref 0.0–0.2)

## 2022-03-28 SURGERY — ESOPHAGOGASTRODUODENOSCOPY (EGD) WITH PROPOFOL
Anesthesia: Monitor Anesthesia Care

## 2022-03-28 MED ORDER — LACTATED RINGERS IV SOLN
INTRAVENOUS | Status: AC | PRN
  Administered 2022-03-28: 10 mL/h via INTRAVENOUS

## 2022-03-28 MED ORDER — PANTOPRAZOLE SODIUM 40 MG PO TBEC
40.0000 mg | DELAYED_RELEASE_TABLET | Freq: Two times a day (BID) | ORAL | Status: DC
  Administered 2022-03-28: 40 mg via ORAL
  Filled 2022-03-28: qty 1

## 2022-03-28 MED ORDER — SUCRALFATE 1 GM/10ML PO SUSP: 1.0000 g | Freq: Two times a day (BID) | ORAL | 0 refills | Status: AC

## 2022-03-28 MED ORDER — PROPOFOL 500 MG/50ML IV EMUL
INTRAVENOUS | Status: DC | PRN
  Administered 2022-03-28: 150 ug/kg/min via INTRAVENOUS
  Administered 2022-03-28: 60 mg via INTRAVENOUS
  Administered 2022-03-28: 10 mg via INTRAVENOUS

## 2022-03-28 MED ORDER — PANTOPRAZOLE SODIUM 40 MG PO TBEC: 40.0000 mg | DELAYED_RELEASE_TABLET | Freq: Two times a day (BID) | ORAL | 2 refills | Status: AC

## 2022-03-28 MED ORDER — LIDOCAINE 2% (20 MG/ML) 5 ML SYRINGE
INTRAMUSCULAR | Status: DC | PRN
  Administered 2022-03-28: 40 mg via INTRAVENOUS

## 2022-03-28 MED ORDER — ACETAMINOPHEN 325 MG PO TABS: 650.0000 mg | ORAL_TABLET | ORAL | 0 refills | Status: AC | PRN

## 2022-03-28 MED ORDER — SUCRALFATE 1 GM/10ML PO SUSP
1.0000 g | Freq: Two times a day (BID) | ORAL | Status: DC
Start: 2022-03-28 — End: 2022-03-28
  Administered 2022-03-28: 1 g via ORAL
  Filled 2022-03-28: qty 10

## 2022-03-28 MED ORDER — PHENYLEPHRINE 80 MCG/ML (10ML) SYRINGE FOR IV PUSH (FOR BLOOD PRESSURE SUPPORT)
PREFILLED_SYRINGE | INTRAVENOUS | Status: DC | PRN
  Administered 2022-03-28 (×2): 80 ug via INTRAVENOUS

## 2022-03-28 SURGICAL SUPPLY — 15 items

## 2022-03-28 NOTE — Transfer of Care (Signed)
Immediate Anesthesia Transfer of Care Note  Patient: Tristan Houston  Procedure(s) Performed: ESOPHAGOGASTRODUODENOSCOPY (EGD) WITH PROPOFOL COLONOSCOPY POLYPECTOMY BIOPSY  Patient Location: PACU  Anesthesia Type:General  Level of Consciousness: drowsy  Airway & Oxygen Therapy: Patient Spontanous Breathing and Patient connected to nasal cannula oxygen  Post-op Assessment: Report given to RN and Post -op Vital signs reviewed and stable  Post vital signs: Reviewed and stable  Last Vitals:  Vitals Value Taken Time  BP 95/55 03/28/22 1018  Temp    Pulse 77 03/28/22 1021  Resp 21 03/28/22 1021  SpO2 96 % 03/28/22 1021  Vitals shown include unvalidated device data.  Last Pain:  Vitals:   03/28/22 0859  TempSrc: Temporal  PainSc: 0-No pain         Complications: No notable events documented.

## 2022-03-28 NOTE — Progress Notes (Signed)
DISCHARGE NOTE HOME SRIHARI SHELLHAMMER to be discharged Home per MD order. Discussed prescriptions and follow up appointments with the patient. Prescriptions given to patient; medication list explained in detail. Patient verbalized understanding.  Skin clean, dry and intact without evidence of skin break down, no evidence of skin tears noted. IV catheter discontinued intact. Site without signs and symptoms of complications. Dressing and pressure applied. Pt denies pain at the site currently. No complaints noted.  Patient free of lines, drains, and wounds.   An After Visit Summary (AVS) was printed and given to the patient. Patient escorted via wheelchair, and discharged home via private auto.  Vira Agar, RN

## 2022-03-28 NOTE — Interval H&P Note (Signed)
History and Physical Interval Note:  03/28/2022 9:24 AM  Langston Masker  has presented today for surgery, with the diagnosis of Hematochezia.  Blood loss anemia..  The various methods of treatment have been discussed with the patient and family. After consideration of risks, benefits and other options for treatment, the patient has consented to  Procedure(s): ESOPHAGOGASTRODUODENOSCOPY (EGD) WITH PROPOFOL (N/A) COLONOSCOPY (N/A) as a surgical intervention.  The patient's history has been reviewed, patient examined, no change in status, stable for surgery.  I have reviewed the patient's chart and labs.  Questions were answered to the patient's satisfaction.     [employer]

## 2022-03-28 NOTE — Anesthesia Preprocedure Evaluation (Signed)
Anesthesia Evaluation  Patient identified by MRN, date of birth, ID band Patient awake    Reviewed: Allergy & Precautions, H&P , NPO status , Patient's Chart, lab work & pertinent test results  Airway Mallampati: II  TM Distance: >3 FB Neck ROM: Full    Dental no notable dental hx. (+) Teeth Intact, Dental Advisory Given   Pulmonary neg pulmonary ROS,    Pulmonary exam normal breath sounds clear to auscultation       Cardiovascular hypertension, Pt. on medications  Rhythm:Regular Rate:Normal     Neuro/Psych negative neurological ROS  negative psych ROS   GI/Hepatic negative GI ROS, Neg liver ROS,   Endo/Other  negative endocrine ROS  Renal/GU negative Renal ROS  negative genitourinary   Musculoskeletal   Abdominal   Peds  Hematology  (+) Blood dyscrasia, anemia ,   Anesthesia Other Findings   Reproductive/Obstetrics negative OB ROS                             Anesthesia Physical Anesthesia Plan  ASA: 2  Anesthesia Plan: MAC   Post-op Pain Management: Minimal or no pain anticipated   Induction: Intravenous  PONV Risk Score and Plan: 1 and Propofol infusion  Airway Management Planned: Nasal Cannula and Simple Face Mask  Additional Equipment:   Intra-op Plan:   Post-operative Plan:   Informed Consent: I have reviewed the patients History and Physical, chart, labs and discussed the procedure including the risks, benefits and alternatives for the proposed anesthesia with the patient or authorized representative who has indicated his/her understanding and acceptance.     Dental advisory given  Plan Discussed with: CRNA  Anesthesia Plan Comments:         Anesthesia Quick Evaluation

## 2022-03-28 NOTE — Anesthesia Postprocedure Evaluation (Signed)
Anesthesia Post Note  Patient: Tristan Houston  Procedure(s) Performed: ESOPHAGOGASTRODUODENOSCOPY (EGD) WITH PROPOFOL COLONOSCOPY POLYPECTOMY BIOPSY     Patient location during evaluation: PACU Anesthesia Type: MAC Level of consciousness: awake and alert Pain management: pain level controlled Vital Signs Assessment: post-procedure vital signs reviewed and stable Respiratory status: spontaneous breathing, nonlabored ventilation and respiratory function stable Cardiovascular status: stable and blood pressure returned to baseline Postop Assessment: no apparent nausea or vomiting Anesthetic complications: no   No notable events documented.  Last Vitals:  Vitals:   03/28/22 1050 03/28/22 1114  BP: (!) 157/73 (!) 155/65  Pulse: 66 65  Resp: 13 18  Temp: 36.9 C (!) 36.4 C  SpO2: 100% 100%    Last Pain:  Vitals:   03/28/22 1114  TempSrc: Oral  PainSc:                  Daziya Redmond,W. EDMOND

## 2022-03-28 NOTE — TOC Transition Note (Signed)
Transition of Care [facility]) - CM/SW Discharge Note   Patient Details  Name: JAISON PETRAGLIA MRN: [id number] Date of Birth: [birth year]  [facility]) CM/SW Contact:  Tom-Johnson, Renea Ee, RN Phone Number: 03/28/2022, 2:53 PM   Clinical Narrative:     Patient is scheduled for discharge today. Had EGD and Colonoscopy today. No post hospital needs or recommendations noted. Family to transport at discharge. No further TOC needs noted.   Final next level of care: Home/Self Care Barriers to Discharge: Barriers Resolved   Patient Goals and CMS Choice Patient states their goals for this hospitalization and ongoing recovery are:: To return home CMS Medicare.gov Compare Post Acute Care list provided to:: Patient Choice offered to / list presented to : NA  Discharge Placement                Patient to be transferred to facility by: Family      Discharge Plan and Services   Discharge Planning Services: CM Consult Post Acute Care Choice: NA          DME Arranged: N/A DME Agency: NA       HH Arranged: NA HH Agency: NA        Social Determinants of Health (SDOH) Interventions     Readmission Risk Interventions     No data to display

## 2022-03-28 NOTE — Op Note (Signed)
[facility] Patient Name: Tristan Houston Procedure Date : 03/28/2022 MRN: [id number] Attending MD: Justice Britain , MD Date of Birth: 1946-05-20 CSN: [id number] Age: 76 Admit Type: Inpatient Procedure:                Colonoscopy Indications:              Hematochezia, Melena, Gastrointestinal occult blood                            loss, Acute post hemorrhagic anemia Providers:                Justice Britain, MD, Ervin Knack, RN, [facility] Technician, Technician Referring MD:             Hospitalists Medicines:                Monitored Anesthesia Care Complications:            No immediate complications. Estimated Blood Loss:     Estimated blood loss was minimal. Procedure:                Pre-Anesthesia Assessment:                           - Prior to the procedure, a History and Physical                            was performed, and patient medications and                            allergies were reviewed. The patient's tolerance of                            previous anesthesia was also reviewed. The risks                            and benefits of the procedure and the sedation                            options and risks were discussed with the patient.                            All questions were answered, and informed consent                            was obtained. Prior Anticoagulants: The patient has                            taken no previous anticoagulant or antiplatelet                            agents. ASA Grade Assessment: III - A patient with  severe systemic disease. After reviewing the risks                            and benefits, the patient was deemed in                            satisfactory condition to undergo the procedure.                           After obtaining informed consent, the colonoscope                            was passed under direct vision. Throughout the                             procedure, the patient's blood pressure, pulse, and                            oxygen saturations were monitored continuously. The                            CF-HQ190L ([id number] Olympus colonoscope was                            introduced through the anus and advanced to the 5                            cm into the ileum. The colonoscopy was performed                            without difficulty. The patient tolerated the                            procedure. The quality of the bowel preparation was                            adequate. The terminal ileum, ileocecal valve,                            appendiceal orifice, and rectum were photographed. Scope In: 9:56:11 AM Scope Out: 10:11:21 AM Scope Withdrawal Time: 0 hours 11 minutes 51 seconds  Total Procedure Duration: 0 hours 15 minutes 10 seconds  Findings:      The digital rectal exam findings include hemorrhoids. Pertinent       negatives include no palpable rectal lesions.      The terminal ileum and ileocecal valve appeared normal.      A 7 mm polyp was found in the transverse colon. The polyp was       semi-sessile. The polyp was removed with a cold snare. Resection and       retrieval were complete.      Many small and large-mouthed diverticula were found in the recto-sigmoid       colon, sigmoid colon, descending colon and transverse colon.      Normal mucosa was found in the entire colon otherwise.  Non-bleeding non-thrombosed internal hemorrhoids were found during       retroflexion, during perianal exam and during digital exam. The       hemorrhoids were Grade II (internal hemorrhoids that prolapse but reduce       spontaneously). Impression:               - Hemorrhoids found on digital rectal exam.                           - The examined portion of the ileum was normal.                           - One 7 mm polyp in the transverse colon, removed                            with a cold snare. Resected  and retrieved.                           - Diverticulosis in the recto-sigmoid colon, in the                            sigmoid colon, in the descending colon and in the                            transverse colon.                           - Normal mucosa in the entire examined colon                            otherwise.                           - Non-bleeding non-thrombosed internal hemorrhoids. Recommendation:           - The patient will be observed post-procedure,                            until all discharge criteria are met.                           - Return patient to hospital ward for ongoing care.                           - Resume regular diet.                           - Use FiberCon 1-2 tablets PO daily.                           - Continue present medications otherwise.                           - Await pathology results.                           -  Repeat colonoscopy in 01/01/09 years for                            surveillance based on pathology results.                           - Etiology of presentation most likely a                            combination of a diverticular bleed with some more                            chronic anemia potentially from the EGD findings of                            gastric ulcers.                           - Agree with continued oral Iron supplementation.                           - IV Iron while inhouse x 1 dose.                           - If evidence of persisting anemia in outpatient                            setting, then consider VCE.                           - The findings and recommendations were discussed                            with the patient.                           - The findings and recommendations were discussed                            with the referring physician. Procedure Code(s):        --- Professional ---                           [phone], Colonoscopy, flexible; with removal of                             tumor(s), polyp(s), or other lesion(s) by snare                            technique Diagnosis Code(s):        --- Professional ---                           K64.1, Second degree hemorrhoids  K63.5, Polyp of colon                           K92.1, Melena (includes Hematochezia)                           R19.5, Other fecal abnormalities                           D62, Acute posthemorrhagic anemia                           K57.30, Diverticulosis of large intestine without                            perforation or abscess without bleeding CPT copyright 2019 American [employer] Association. All rights reserved. The codes documented in this report are preliminary and upon coder review may  be revised to meet current compliance requirements. Justice Britain, MD 03/28/2022 10:33:56 AM Number of Addenda: 0

## 2022-03-28 NOTE — Discharge Summary (Signed)
DISCHARGE SUMMARY  Tristan Houston  MR#: [id number]  DOB:1946/06/08  Date of Admission: 03/26/2022 Date of Discharge: 03/28/2022  Attending Physician:Micala Saltsman Hennie Duos, MD  Patient's PCP:Pcp, No  Consults:  GI  Disposition: D/C home   Follow-up Appts:  Follow-up Information     Mansouraty, Telford Nab., MD Follow up in 1 month(s).   Specialties: Gastroenterology, Internal Medicine Why: The office will call you to arrange an appointment. If you do not hear from them in the next 5-7 days give them a call at this number. Contact information: [place] [address] [phone]                 Tests Needing Follow-up: -Transverse colon polyp biopsy pending at time of discharge  Discharge Diagnoses: Acute blood loss anemia Hiatal hernia Gastric ulcers Erosive gastropathy Hemorrhoids 7 mm transverse colon polyp Diverticulosis  Initial presentation: 76 year old with a history of HTN, HLD, and BPH who was transferred to [facility] from [facility] where he presented with a 3-day history of bright red blood per rectum associated with diarrhea.  He was experiencing dizziness and diaphoresis as well as epigastric pain and diffuse abdominal cramping.  In the ER the patient was found to have melena.  His hemoglobin was 10.  Hospital Course: The patient was transferred to [facility] for GI consultation and evaluation - EGD 8/1 noted no gross lesions of the esophagus and a 2 cm hiatal hernia with nonbleeding gastric ulcers in the antrum and erosive gastropathy as well as a duodenal scar in the bulb -Carafate twice daily x1 month and PPI twice daily suggested -we will need repeat EGD in 4 months -colonoscopy noted hemorrhoids and a 7 mm transverse colon polyp which was removed with a cold snare as well as diverticulosis but no other acute issues -FiberCon 1 to 2 tablets p.o. 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 -continue iron supplementation -postprocedure the patient was alert conversant and able to tolerate a diet without any difficulty -he was highly motivated to be discharged home and felt to be clinically stable -he will follow-up with GI and the timing noted above to allow for a repeat EGD to assess for healing -pathology results will be followed up by the GI team -hemoglobin is stable at time of discharge -patient advised to stop taking NSAIDs to include his previously prescribed Voltaren  Allergies as of 03/28/2022   No Known Allergies      Medication List     STOP taking these medications    diclofenac 75 MG EC tablet Commonly known as: VOLTAREN       TAKE these medications    acetaminophen 325 MG tablet Commonly known as: Tylenol Take 2 tablets (650 mg total) by mouth every 4 (four) hours as needed.   ferrous sulfate 325 (65 FE) MG tablet Take 325 mg by mouth daily with breakfast.   lisinopril 40 MG tablet Commonly known as: ZESTRIL Take 40 mg by mouth daily.   pantoprazole 40 MG tablet Commonly known as: PROTONIX Take 1 tablet (40 mg total) by mouth 2 (two) times daily.   simvastatin 40 MG tablet Commonly known as: ZOCOR Take 40 mg by mouth every evening.   sucralfate 1 GM/10ML suspension Commonly known as: CARAFATE Take 10 mLs (1 g total) by mouth 2 (two) times daily.  Day of Discharge BP (!) 155/65 (BP Location: Right Arm)   Pulse 65   Temp (!) 97.5 F (36.4 C) (Oral)   Resp 18   Ht 6' (1.829 m)   Wt 104.3 kg   SpO2 100%   BMI 31.19 kg/m   Physical Exam: General: No acute respiratory distress Lungs: Clear to auscultation bilaterally without wheezes or crackles Cardiovascular: Regular rate and rhythm without murmur gallop or rub normal S1 and S2 Abdomen: Nontender, nondistended, soft, bowel sounds positive, no rebound, no ascites, no appreciable  mass Extremities: No significant cyanosis, clubbing, or edema bilateral lower extremities  Basic Metabolic Panel: Recent Labs  Lab 03/27/22 1238  NA 137  K 4.0  CL 108  CO2 24  GLUCOSE 157*  BUN 16  CREATININE 1.20  CALCIUM 8.5*    Liver Function Tests: Recent Labs  Lab 03/27/22 1238  AST 20  ALT 17  ALKPHOS 45  BILITOT 0.8  PROT 5.1*  ALBUMIN 3.2*   CBC: Recent Labs  Lab 03/27/22 0242 03/27/22 1238 03/28/22 0145  WBC 5.5 5.7 6.2  HGB 7.9* 8.5* 8.7*  HCT 24.1* 25.3* 26.4*  MCV 94.9 95.1 95.3  PLT 200 217 249    Time spent in discharge (includes decision making & examination of pt): 30 minutes  03/28/2022, 2:26 PM   Cherene Altes, MD [facility] Office  [phone]

## 2022-03-28 NOTE — Anesthesia Procedure Notes (Signed)
Procedure Name: MAC Date/Time: 03/28/2022 9:39 AM  Performed by: Leonor Liv, CRNAPre-anesthesia Checklist: Patient identified, Emergency Drugs available, Suction available, Patient being monitored and Timeout performed Patient Re-evaluated:Patient Re-evaluated prior to induction Oxygen Delivery Method: Nasal cannula Airway Equipment and Method: Bite block Placement Confirmation: positive ETCO2 Dental Injury: Teeth and Oropharynx as per pre-operative assessment

## 2022-03-28 NOTE — Plan of Care (Signed)
  Problem: Activity: Goal: Risk for activity intolerance will decrease Outcome: Completed/Met   Problem: Elimination: Goal: Will not experience complications related to bowel motility Outcome: Completed/Met Goal: Will not experience complications related to urinary retention Outcome: Completed/Met   Problem: Safety: Goal: Ability to remain free from injury will improve Outcome: Completed/Met

## 2022-03-28 NOTE — Op Note (Signed)
[facility] Patient Name: Tristan Houston Procedure Date : 03/28/2022 MRN: [id number] Attending MD: Justice Britain , MD Date of Birth: 1946-03-07 CSN: [id number] Age: 76 Admit Type: Inpatient Procedure:                Upper GI endoscopy Indications:              Acute post hemorrhagic anemia, Hematochezia,                            Melena, Occult blood in stool, Recent                            gastrointestinal bleeding Providers:                Justice Britain, MD, Ervin Knack, RN, [facility] Technician, Technician Referring MD:             Hospitalists Medicines:                Monitored Anesthesia Care Complications:            No immediate complications. Estimated Blood Loss:     Estimated blood loss was minimal. Procedure:                Pre-Anesthesia Assessment:                           - Prior to the procedure, a History and Physical                            was performed, and patient medications and                            allergies were reviewed. The patient's tolerance of                            previous anesthesia was also reviewed. The risks                            and benefits of the procedure and the sedation                            options and risks were discussed with the patient.                            All questions were answered, and informed consent                            was obtained. Prior Anticoagulants: The patient has                            taken no previous anticoagulant or antiplatelet                            agents. ASA Grade  Assessment: III - A patient with                            severe systemic disease. After reviewing the risks                            and benefits, the patient was deemed in                            satisfactory condition to undergo the procedure.                           After obtaining informed consent, the endoscope was                             passed under direct vision. Throughout the                            procedure, the patient's blood pressure, pulse, and                            oxygen saturations were monitored continuously. The                            [id number]-[id number] ([id number] Olympus endoscope was introduced                            through the mouth, and advanced to the second part                            of duodenum. The upper GI endoscopy was                            accomplished without difficulty. The patient                            tolerated the procedure. Scope In: Scope Out: Findings:      No gross lesions were noted in the entire esophagus.      The Z-line was irregular and was found 43 cm from the incisors.      A 2 cm hiatal hernia was present.      Few non-bleeding linear gastric ulcers with a clean ulcer base (Forrest       Class III) were found in the gastric antrum. The largest lesion was 9 mm       in largest dimension.      Multiple dispersed small erosions with no bleeding and no stigmata of       recent bleeding were found in the gastric antrum.      Patchy mildly erythematous mucosa without bleeding was found in the       entire examined stomach. Biopsies were taken with a cold forceps for       histology and Helicobacter pylori testing.      A medium scar (likely healed ulcer) was found in the duodenal bulb.      No other gross lesions were noted  in the duodenal bulb, in the first       portion of the duodenum and in the second portion of the duodenum. Impression:               - No gross lesions in esophagus. Z-line irregular,                            43 cm from the incisors.                           - 2 cm hiatal hernia.                           - Non-bleeding gastric ulcers with a clean ulcer                            base (Forrest Class III) in the antrum.                           - Erosive gastropathy with no bleeding and no                            stigmata of recent  bleeding in the antrum.                           - Erythematous mucosa in the stomach throughout.                            Biopsied.                           - Duodenal scar in bulb. No other gross lesions in                            the duodenal bulb, in the first portion of the                            duodenum and in the second portion of the duodenum. Recommendation:           - Proceed to scheduled colonoscopy.                           - Transition to PO PPI 40 mg BID.                           - Carafate BID x 45-month                           - Await pathology results.                           - Repeat upper endoscopy in 4 months to check                            healing.                           -  The findings and recommendations were discussed                            with the patient.                           - The findings and recommendations were discussed                            with the referring physician. Procedure Code(s):        --- Professional ---                           [phone], Esophagogastroduodenoscopy, flexible,                            transoral; with biopsy, single or multiple Diagnosis Code(s):        --- Professional ---                           K22.8, Other specified diseases of esophagus                           K44.9, Diaphragmatic hernia without obstruction or                            gangrene                           K25.9, Gastric ulcer, unspecified as acute or                            chronic, without hemorrhage or perforation                           K31.89, Other diseases of stomach and duodenum                           D62, Acute posthemorrhagic anemia                           K92.1, Melena (includes Hematochezia)                           R19.5, Other fecal abnormalities                           K92.2, Gastrointestinal hemorrhage, unspecified CPT copyright 2019 American Medical Association. All rights  reserved. The codes documented in this report are preliminary and upon coder review may  be revised to meet current compliance requirements. Justice Britain, MD 03/28/2022 10:28:02 AM Number of Addenda: 0

## 2022-03-30 ENCOUNTER — Encounter: Payer: Self-pay | Admitting: Gastroenterology

## 2022-03-30 LAB — SURGICAL PATHOLOGY

## 2022-09-05 ENCOUNTER — Encounter: Payer: Self-pay | Admitting: Gastroenterology

## 2022-09-05 ENCOUNTER — Ambulatory Visit (INDEPENDENT_AMBULATORY_CARE_PROVIDER_SITE_OTHER): Payer: No Typology Code available for payment source | Admitting: Gastroenterology

## 2022-09-05 ENCOUNTER — Other Ambulatory Visit (INDEPENDENT_AMBULATORY_CARE_PROVIDER_SITE_OTHER): Payer: No Typology Code available for payment source

## 2022-09-05 VITALS — BP 142/72 | HR 65 | Ht 72.0 in | Wt 251.8 lb

## 2022-09-05 DIAGNOSIS — K279 Peptic ulcer, site unspecified, unspecified as acute or chronic, without hemorrhage or perforation: Secondary | ICD-10-CM

## 2022-09-05 DIAGNOSIS — K259 Gastric ulcer, unspecified as acute or chronic, without hemorrhage or perforation: Secondary | ICD-10-CM | POA: Diagnosis not present

## 2022-09-05 DIAGNOSIS — Z86010 Personal history of colonic polyps: Secondary | ICD-10-CM

## 2022-09-05 DIAGNOSIS — Z862 Personal history of diseases of the blood and blood-forming organs and certain disorders involving the immune mechanism: Secondary | ICD-10-CM | POA: Diagnosis not present

## 2022-09-05 LAB — CBC
HCT: 38.2 % — ABNORMAL LOW (ref 39.0–52.0)
Hemoglobin: 12.6 g/dL — ABNORMAL LOW (ref 13.0–17.0)
MCHC: 32.9 g/dL (ref 30.0–36.0)
MCV: 93.2 fl (ref 78.0–100.0)
Platelets: 297 10*3/uL (ref 150.0–400.0)
RBC: 4.1 Mil/uL — ABNORMAL LOW (ref 4.22–5.81)
RDW: 13.3 % (ref 11.5–15.5)
WBC: 8.4 10*3/uL (ref 4.0–10.5)

## 2022-09-05 LAB — BASIC METABOLIC PANEL
BUN: 23 mg/dL (ref 6–23)
CO2: 24 mEq/L (ref 19–32)
Calcium: 9.1 mg/dL (ref 8.4–10.5)
Chloride: 105 mEq/L (ref 96–112)
Creatinine, Ser: 1.29 mg/dL (ref 0.40–1.50)
GFR: 54.36 mL/min — ABNORMAL LOW (ref >60.00)
Glucose, Bld: 86 mg/dL (ref 70–99)
Potassium: 4.7 mEq/L (ref 3.5–5.1)
Sodium: 140 mEq/L (ref 135–145)

## 2022-09-05 LAB — IBC + FERRITIN
Ferritin: 50.1 ng/mL (ref 22.0–322.0)
Iron: 87 ug/dL (ref 42–165)
Saturation Ratios: 25.2 % (ref 20.0–50.0)
TIBC: 345.8 ug/dL (ref 250.0–450.0)
Transferrin: 247 mg/dL (ref 212.0–360.0)

## 2022-09-05 NOTE — Patient Instructions (Signed)
Your provider has requested that you go to the basement level for lab work before leaving today. Press "B" on the elevator. The lab is located at the first door on the left as you exit the elevator.   You have been scheduled for an endoscopy. Please follow written instructions given to you at your visit today. If you use inhalers (even only as needed), please bring them with you on the day of your procedure.  Due to recent changes in healthcare laws, you may see the results of your imaging and laboratory studies on MyChart before your provider has had a chance to review them.  We understand that in some cases there may be results that are confusing or concerning to you. Not all laboratory results come back in the same time frame and the provider may be waiting for multiple results in order to interpret others.  Please give [place] 48 hours in order for your provider to thoroughly review all the results before contacting the office for clarification of your results.   Thank you for choosing me and [facility].  Dr. Rush Landmark

## 2022-09-05 NOTE — Progress Notes (Signed)
GASTROENTEROLOGY OUTPATIENT CLINIC VISIT   Primary Care Provider Pcp, No No address on file None   Patient Profile: Tristan Houston is a 77 y.o. male with a pmh significant for hypertension, hyperlipidemia, PUD (manifested as GU's), colon polyps, diverticulosis.  The patient presents to the [facility] for an evaluation and management of problem(s) noted below:  Problem List 1. Multiple gastric ulcers   2. PUD (peptic ulcer disease)   3. History of anemia   4. History of colonic polyps     History of Present Illness This is a patient that I met in the hospital in the summer of last year.  He presented with acute blood loss anemia and melena/maroon stools.  He underwent an EGD with findings of gastric ulcers and gastritis with negative H. pylori biopsies.  He was found to have a benign polyp and diverticulosis.  Patient was able to be discharged.  He has remained on PPI therapy and is doing well.  He reports being evaluated recently with blood work at the [place] but does not know the results of those blood works.  He continues to take his PPI as well as iron supplementation.  His stools are slightly darker but formed.  He has no other GI symptoms currently.  GI Review of Systems Positive as above Negative for dysphagia, odynophagia, nausea, vomiting, pain, alteration of bowel habits  Review of Systems General: Denies fevers/chills/weight loss unintentionally Cardiovascular: Denies chest pain Pulmonary: Denies shortness of breath Gastroenterological: See HPI Genitourinary: Denies darkened urine  Hematological: Denies easy bruising/bleeding Dermatological: Denies jaundice Psychological: Mood is stable   Medications Current Outpatient Medications  Medication Sig Dispense Refill   acetaminophen (TYLENOL) 325 MG tablet Take 2 tablets (650 mg total) by mouth every 4 (four) hours as needed. 100 tablet 0   ferrous sulfate 325 (65 FE) MG tablet Take 325 mg by mouth  daily with breakfast.     lisinopril (ZESTRIL) 40 MG tablet Take 40 mg by mouth daily.     pantoprazole (PROTONIX) 40 MG tablet Take 1 tablet (40 mg total) by mouth 2 (two) times daily. 60 tablet 2   simvastatin (ZOCOR) 40 MG tablet Take 40 mg by mouth every evening.     sucralfate (CARAFATE) 1 GM/10ML suspension Take 10 mLs (1 g total) by mouth 2 (two) times daily. 600 mL 0   No current facility-administered medications for this visit.    Allergies No Known Allergies  Histories Past Medical History:  Diagnosis Date   Hypertension    Past Surgical History:  Procedure Laterality Date   BIOPSY  03/28/2022   Procedure: BIOPSY;  Surgeon: Rush Landmark Telford Nab., MD;  Location: [place];  Service: Gastroenterology;;   COLONOSCOPY N/A 03/28/2022   Procedure: COLONOSCOPY;  Surgeon: Irving Copas., MD;  Location: [facility];  Service: Gastroenterology;  Laterality: N/A;   ESOPHAGOGASTRODUODENOSCOPY (EGD) WITH PROPOFOL N/A 03/28/2022   Procedure: ESOPHAGOGASTRODUODENOSCOPY (EGD) WITH PROPOFOL;  Surgeon: Rush Landmark Telford Nab., MD;  Location: [facility];  Service: Gastroenterology;  Laterality: N/A;   POLYPECTOMY  03/28/2022   Procedure: POLYPECTOMY;  Surgeon: Mansouraty, Telford Nab., MD;  Location: [facility] END[facility]COPY;  Service: Gastroenterology;;   Social History   Socioeconomic History   Marital status: Married    Spouse name: Not on file   Number of children: Not on file   Years of education: Not on file   Highest education level: Not on file  Occupational History   Not on file  Tobacco Use  Smoking status: Never   Smokeless tobacco: Never  Substance and Sexual Activity   Alcohol use: Not Currently   Drug use: Never   Sexual activity: Not on file  Other Topics Concern   Not on file  Social History Narrative   Not on file   Social Determinants of Health   Financial Resource Strain: Not on file  Food Insecurity: Not on file  Transportation Needs: Not on file   Physical Activity: Not on file  Stress: Not on file  Social Connections: Not on file  Intimate Partner Violence: Not on file   Family History  Problem Relation Age of Onset   Colon cancer Neg Hx    Esophageal cancer Neg Hx    Inflammatory bowel disease Neg Hx    Liver disease Neg Hx    Pancreatic cancer Neg Hx    Rectal cancer Neg Hx    Stomach cancer Neg Hx    I have reviewed his medical, social, and family history in detail and updated the electronic medical record as necessary.    PHYSICAL EXAMINATION  BP (!) 142/72   Pulse 65   Ht 6' (1.829 m)   Wt 251 lb 12.8 oz (114.2 kg)   BMI 34.15 kg/m  Wt Readings from Last 3 Encounters:  09/05/22 251 lb 12.8 oz (114.2 kg)  03/28/22 230 lb (104.3 kg)  GEN: NAD, appears stated age, doesn't appear chronically ill PSYCH: Cooperative, without pressured speech EYE: Conjunctivae pink, sclerae anicteric ENT: MMM CV: Nontachycardic RESP: No audible wheezing GI: NABS, soft, NT/ND, without rebound or guarding MSK/EXT: No significant lower extremity edema SKIN: No jaundice NEURO:  Alert & Oriented x 3, no focal deficits   REVIEW OF DATA  I reviewed the following data at the time of this encounter:  GI Procedures and Studies  August 2023 EGD - No gross lesions in esophagus. Z-line irregular, 43 cm from the incisors. - 2 cm hiatal hernia. - Non-bleeding gastric ulcers with a clean ulcer base (Forrest Class III) in the antrum. - Erosive gastropathy with no bleeding and no stigmata of recent bleeding in the antrum. - Erythematous mucosa in the stomach throughout. Biopsied. - Duodenal scar in bulb. No other gross lesions in the duodenal bulb, in the first portion of the duodenum and in the second portion of the duodenum.  August 2023 colonoscopy - Hemorrhoids found on digital rectal exam. - The examined portion of the ileum was normal. - One 7 mm polyp in the transverse colon, removed with a cold snare. Resected and retrieved. -  Diverticulosis in the recto-sigmoid colon, in the sigmoid colon, in the descending colon and in the transverse colon. - Normal mucosa in the entire examined colon otherwise. - Non-bleeding non-thrombosed internal hemorrhoids.  Pathology FINAL MICROSCOPIC DIAGNOSIS:  A.   STOMACH, BIOPSY:  -    Gastric antral mucosa with mild foveolar hyperplasia.  -    Gastric oxyntic mucosa with no specific pathologic diagnosis.  -    Negative for an inflammatory pattern predictive of Helicobacter  pylori infection.  -    Negative for intestinal metaplasia and malignancy.  B.   COLON, TRANSVERSE, POLYPECTOMY:  -    Polypoid colonic mucosa without features diagnostic for a specific  type of mucosal polyp (negative for adenomatous change and polypoid  epithelial serration), microscopically examined at 3 additional HE  levels.    Laboratory Studies  Reviewed those in epic and care everywhere  Imaging Studies  No relevant studies to review  ASSESSMENT  Mr. Barsky is a 77 y.o. male with a pmh significant for hypertension, hyperlipidemia, PUD (manifested as GU's), colon polyps, diverticulosis.   The patient is seen today for evaluation and management of:  1. Multiple gastric ulcers   2. PUD (peptic ulcer disease)   3. History of anemia   4. History of colonic polyps    The patient is hemodynamically and clinically stable at this time.  Seems to be doing well overall.  We will recheck his blood work to see where things stand in regards to his previous anemia.  He is due for an upper endoscopy to ensure healing of the gastric ulcers.  We will work on getting this scheduled.  If the patient has persistent evidence of iron deficiency after repeat endoscopy, then video capsule endoscopy may need to be considered.  He will be due for colon polyp surveillance in 2029, but he would have to be quite healthy for [place] to continue colon cancer screening at that point as he will be in his young 30s.  The risks and  benefits of endoscopic evaluation were discussed with the patient; these include but are not limited to the risk of perforation, infection, bleeding, missed lesions, lack of diagnosis, severe illness requiring hospitalization, as well as anesthesia and sedation related illnesses.  The patient and/or family is agreeable to proceed.  All patient questions were answered to the best of my ability, and the patient agrees to the aforementioned plan of action with follow-up as indicated.   PLAN  Laboratories as outlined below Proceed with scheduling surveillance endoscopy Continue PPI twice daily for now - Hopefully will be able to decrease dosing once we know ulcers are healed Continue oral iron as you are doing   Orders Placed This Encounter  Procedures   Procedural/ Surgical Case Request: ESOPHAGOGASTRODUODENOSCOPY (EGD) WITH PROPOFOL   CBC   Basic Metabolic Panel (BMET)   IBC + Ferritin   Ambulatory referral to Gastroenterology   Diet NPO time specified   Vital signs per Moderate Sedation protocol   Discharge patient to home when patient meets Aldrete score   Discharge instructions    New Prescriptions   No medications on file   Modified Medications   No medications on file    Planned Follow Up No follow-ups on file.   Total Time in Face-to-Face and in Coordination of Care for patient including independent/personal interpretation/review of prior testing, medical history, examination, medication adjustment, communicating results with the patient directly, and documentation within the EHR is 30 minutes.   Tristan Britain, MD [facility] Office # [phone]

## 2022-09-06 ENCOUNTER — Encounter: Payer: Self-pay | Admitting: Gastroenterology

## 2022-09-08 DIAGNOSIS — K259 Gastric ulcer, unspecified as acute or chronic, without hemorrhage or perforation: Secondary | ICD-10-CM | POA: Insufficient documentation

## 2022-09-08 DIAGNOSIS — Z86010 Personal history of colonic polyps: Secondary | ICD-10-CM | POA: Insufficient documentation

## 2022-09-08 DIAGNOSIS — Z862 Personal history of diseases of the blood and blood-forming organs and certain disorders involving the immune mechanism: Secondary | ICD-10-CM | POA: Insufficient documentation

## 2022-09-08 DIAGNOSIS — K279 Peptic ulcer, site unspecified, unspecified as acute or chronic, without hemorrhage or perforation: Secondary | ICD-10-CM | POA: Insufficient documentation

## 2022-10-19 ENCOUNTER — Ambulatory Visit (HOSPITAL_COMMUNITY): Admit: 2022-10-19 | Payer: No Typology Code available for payment source | Admitting: Gastroenterology

## 2022-10-19 ENCOUNTER — Encounter (HOSPITAL_COMMUNITY): Payer: Self-pay

## 2022-10-19 SURGERY — ESOPHAGOGASTRODUODENOSCOPY (EGD) WITH PROPOFOL
Anesthesia: Monitor Anesthesia Care

## 2022-10-31 ENCOUNTER — Encounter: Payer: No Typology Code available for payment source | Admitting: Gastroenterology
